# Patient Record
Sex: Female | Born: 1970 | Race: Black or African American | Hispanic: No | Marital: Married
Health system: Southern US, Community
[De-identification: ages and names within clinical notes are randomized; demographics above are authoritative.]

---

## 2011-05-02 DIAGNOSIS — G35 Multiple sclerosis: Secondary | ICD-10-CM | POA: Insufficient documentation

## 2012-02-18 DIAGNOSIS — H52209 Unspecified astigmatism, unspecified eye: Secondary | ICD-10-CM | POA: Insufficient documentation

## 2012-02-18 DIAGNOSIS — Z8669 Personal history of other diseases of the nervous system and sense organs: Secondary | ICD-10-CM | POA: Insufficient documentation

## 2013-06-03 DIAGNOSIS — N926 Irregular menstruation, unspecified: Secondary | ICD-10-CM | POA: Insufficient documentation

## 2015-05-02 ENCOUNTER — Emergency Department (INDEPENDENT_AMBULATORY_CARE_PROVIDER_SITE_OTHER)
Admission: EM | Admit: 2015-05-02 | Discharge: 2015-05-02 | Disposition: A | Discharge status: Home / Self Care | Payer: PRIVATE HEALTH INSURANCE | Source: Home / Self Care | Attending: Emergency Medicine | Admitting: Emergency Medicine

## 2015-05-02 DIAGNOSIS — M84374A Stress fracture, right foot, initial encounter for fracture: Secondary | ICD-10-CM
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
## 2015-05-02 NOTE — Discharge Instructions (Signed)
Stress Fracture Stress fracture is a small break or crack in a bone. A stress fracture can be fully broken (complete) or partially broken (incomplete). The most common sites for stress fractures are the bones in the front of your feet (metatarsals), your heels (calcaneus), and the long bone of your lower leg (tibia). CAUSES A stress fracture is caused by overuse or repetitive exercise, such as running. It happens when a bone cannot absorb any more shock because the muscles around it are weak. Stress fractures happen most commonly when:  You rapidly increase or start a new physical activity.  You use shoes that are worn out or do not fit you properly.  You exercise on a new surface. RISK FACTORS You may be at higher risk for this type of fracture if:  You have a condition that causes weak bones (osteoporosis).  You are female. Stress fractures are more likely to occur in women. SIGNS AND SYMPTOMS The most common symptom of a stress fracture is feeling pain when you are using the affected part of your body. The pain usually goes away when you are resting. Other symptoms may include:  Swelling of the affected area.  Pain in the area when it is touched.  Decreased pain while resting. Stress fracture pain usually develops over time. DIAGNOSIS Diagnosis may include:   Medical history and physical exam.  X-rays.  Bone scan.  MRI. TREATMENT Treatment depends on the severity of your stress fracture. Treatment usually involves resting, icing, compression, and elevation (RICE) of the affected part of your body. Treatment may also include:  Medicines to reduce inflammation.  A cast or a walking shoe.  Crutches.  Surgery. HOME CARE INSTRUCTIONS If You Have a Cast:  Do not stick anything inside the cast to scratch your skin. Doing that increases your risk of infection.  Check the skin around the cast every day. Report any concerns to your health care provider. You may put lotion  on dry skin around the edges of the cast. Do not apply lotion to the skin underneath the cast.  Keep the cast clean and dry.  Cover the cast with a watertight plastic bag to protect it from water while you take a bath or a shower. Do not let the cast get wet.  Do not put pressure on any part of the cast until it is fully hardened. This may take several hours. If You Have a Walking Shoe:  Wear it as directed by your health care provider. Managing Pain, Stiffness, and Swelling  If directed, apply ice to the injured area:  Put ice in a plastic bag.  Place a towel between your skin and the bag.  Leave the ice on for 20 minutes, 2-3 times per day.  Move your fingers or toes often to avoid stiffness and to lessen swelling.  Raise the injured area above the level of your heart while you are sitting or lying down. Activity  Rest as directed by your health care provider. Ask your health care provider if you may do alternative exercises, such as swimming or biking, while you are healing.  Return to your normal activities as directed by your health care provider. Ask your health care provider what activities are safe for you.  Perform range-of-motion exercises only as directed by your health care provider. Safety  Do not use the injured limb to support yourbody weight until your health care provider says that you can. Use crutches if your health care provider tells you to   do so. General Instructions  Do not use any tobacco products, including cigarettes, chewing tobacco, or electronic cigarettes. Tobacco can delay bone healing. If you need help quitting, ask your health care provider.  Take medicines only as directed by your health care provider.  Keep all follow-up visits as directed by your health care provider. This is important. PREVENTION  Only wear shoes that:  Fit well.  Are not worn out.  Eat a healthy diet that contains vitamin D and calcium. This helps keeps your bones  strong.  Be careful when you start a new physical activity. Give your body time to adjust.  Avoid doing only one kind of activity. Do different exercises, such as swimming and running, so that no single part of your body gets overused.  Do strength-training exercises. SEEK MEDICAL CARE IF:  Your pain gets worse.  You have new symptoms.  You have increased swelling. SEEK IMMEDIATE MEDICAL CARE IF:   You lose feeling in the affected area.   This information is not intended to replace advice given to you by your health care provider. Make sure you discuss any questions you have with your health care provider.   Document Released: 03/29/2002 Document Revised: 01/27/2014 Document Reviewed: 08/11/2013 Elsevier Interactive Patient Education 2016 [employer]  

## 2015-05-02 NOTE — ED Notes (Signed)
Right foot injury x 3 weeks ago, still hurts, swollen last night, throbbs while she tries to sleep

## 2015-05-09 ENCOUNTER — Ambulatory Visit (INDEPENDENT_AMBULATORY_CARE_PROVIDER_SITE_OTHER): Payer: PRIVATE HEALTH INSURANCE

## 2015-05-09 ENCOUNTER — Ambulatory Visit (INDEPENDENT_AMBULATORY_CARE_PROVIDER_SITE_OTHER): Payer: PRIVATE HEALTH INSURANCE | Admitting: Family Medicine

## 2015-05-09 VITALS — BP 160/93 | HR 94 | Wt 193.0 lb

## 2015-05-09 DIAGNOSIS — M84374A Stress fracture, right foot, initial encounter for fracture: Secondary | ICD-10-CM

## 2015-05-09 DIAGNOSIS — M79671 Pain in right foot: Secondary | ICD-10-CM

## 2015-05-09 DIAGNOSIS — M7671 Peroneal tendinitis, right leg: Secondary | ICD-10-CM | POA: Insufficient documentation

## 2015-05-09 NOTE — Patient Instructions (Signed)
Thank you for coming in today. Wean to the post op shoe when able.  Take 2000mg  calcium daily (1000 mg twice daily).  Return in 4 weeks.  We will call with results.   Stress Fracture Stress fracture is a small break or crack in a bone. A stress fracture can be fully broken (complete) or partially broken (incomplete). The most common sites for stress fractures are the bones in the front of your feet (metatarsals), your heels (calcaneus), and the long bone of your lower leg (tibia). CAUSES A stress fracture is caused by overuse or repetitive exercise, such as running. It happens when a bone cannot absorb any more shock because the muscles around it are weak. Stress fractures happen most commonly when:  You rapidly increase or start a new physical activity.  You use shoes that are worn out or do not fit you properly.  You exercise on a new surface. RISK FACTORS You may be at higher risk for this type of fracture if:  You have a condition that causes weak bones (osteoporosis).  You are female. Stress fractures are more likely to occur in women. SIGNS AND SYMPTOMS The most common symptom of a stress fracture is feeling pain when you are using the affected part of your body. The pain usually goes away when you are resting. Other symptoms may include:  Swelling of the affected area.  Pain in the area when it is touched.  Decreased pain while resting. Stress fracture pain usually develops over time. DIAGNOSIS Diagnosis may include:   Medical history and physical exam.  X-rays.  Bone scan.  MRI. TREATMENT Treatment depends on the severity of your stress fracture. Treatment usually involves resting, icing, compression, and elevation (RICE) of the affected part of your body. Treatment may also include:  Medicines to reduce inflammation.  A cast or a walking shoe.  Crutches.  Surgery. HOME CARE INSTRUCTIONS If You Have a Cast:  Do not stick anything inside the cast to  scratch your skin. Doing that increases your risk of infection.  Check the skin around the cast every day. Report any concerns to your health care provider. You may put lotion on dry skin around the edges of the cast. Do not apply lotion to the skin underneath the cast.  Keep the cast clean and dry.  Cover the cast with a watertight plastic bag to protect it from water while you take a bath or a shower. Do not let the cast get wet.  Do not put pressure on any part of the cast until it is fully hardened. This may take several hours. If You Have a Walking Shoe:  Wear it as directed by your health care provider. Managing Pain, Stiffness, and Swelling  If directed, apply ice to the injured area:  Put ice in a plastic bag.  Place a towel between your skin and the bag.  Leave the ice on for 20 minutes, 2-3 times per day.  Move your fingers or toes often to avoid stiffness and to lessen swelling.  Raise the injured area above the level of your heart while you are sitting or lying down. Activity  Rest as directed by your health care provider. Ask your health care provider if you may do alternative exercises, such as swimming or biking, while you are healing.  Return to your normal activities as directed by your health care provider. Ask your health care provider what activities are safe for you.  Perform range-of-motion exercises only as directed  by your health care provider. Safety  Do not use the injured limb to support yourbody weight until your health care provider says that you can. Use crutches if your health care provider tells you to do so. General Instructions  Do not use any tobacco products, including cigarettes, chewing tobacco, or electronic cigarettes. Tobacco can delay bone healing. If you need help quitting, ask your health care provider.  Take medicines only as directed by your health care provider.  Keep all follow-up visits as directed by your health care  provider. This is important. PREVENTION  Only wear shoes that:  Fit well.  Are not worn out.  Eat a healthy diet that contains vitamin D and calcium. This helps keeps your bones strong.  Be careful when you start a new physical activity. Give your body time to adjust.  Avoid doing only one kind of activity. Do different exercises, such as swimming and running, so that no single part of your body gets overused.  Do strength-training exercises. SEEK MEDICAL CARE IF:  Your pain gets worse.  You have new symptoms.  You have increased swelling. SEEK IMMEDIATE MEDICAL CARE IF:   You lose feeling in the affected area.   This information is not intended to replace advice given to you by your health care provider. Make sure you discuss any questions you have with your health care provider.   Document Released: 03/29/2002 Document Revised: 01/27/2014 Document Reviewed: 08/11/2013 Elsevier Interactive Patient Education [employer]

## 2015-05-09 NOTE — Progress Notes (Signed)
   Subjective:    I'm seeing this patient as a consultation for:  Susan MangleKaren Sophia PA-C  CC: Right foot pain  HPI: Patient was seen in urgent care on 05/02/2015 for right foot pain and swelling. She's had pain and swelling since late March. Her primary care provider was concerned about the possibility of a stress fracture. X-rays were originally negative. She was not given any immobilization. In urgent care on the 12th again her symptoms and history were suspicious for metatarsal stress fracture. A she was also having some ankle pain she was given a cam walker boot. Since then she's noted significant improvement in symptoms pain and swelling. She has she uses naproxen at night for some soreness which helps. She has a history of MS and takes vitamin D for it.  Past medical history, Surgical history, Family history not pertinant except as noted below, Social history, Allergies, and medications have been entered into the medical record, reviewed, and no changes needed.   Review of Systems: No headache, visual changes, nausea, vomiting, diarrhea, constipation, dizziness, abdominal pain, skin rash, fevers, chills, night sweats, weight loss, swollen lymph nodes, body aches, joint swelling, muscle aches, chest pain, shortness of breath, mood changes, visual or auditory hallucinations.   Objective:    Filed Vitals:   05/09/15 1053  BP: 160/93  Pulse: 94   General: Well Developed, well nourished, and in no acute distress.  Neuro/Psych: Alert and oriented x3, extra-ocular muscles intact, able to move all 4 extremities, sensation grossly intact. Skin: Warm and dry, no rashes noted.  Respiratory: Not using accessory muscles, speaking in full sentences, trachea midline.  Cardiovascular: Pulses palpable, no extremity edema. Abdomen: Does not appear distended. MSK: Right foot and ankle are largely normal appearing. Mildly tender to palpation mid shaft fourth metatarsal and at the ATFL area in the anterior  lateral ankle. Motion pulses capillary refill and sensation are intact. Patient is able to walk without pain using a postoperative shoe.  X-ray foot pending  No results found for this or any previous visit (from the past 24 hour(s)). No results found.  Impression and Recommendations:   45 year old woman with probable right fourth metatarsal shaft stress fracture. X-ray pending. Discussed plan. Plan for calcium and vitamin D postoperative shoe and watchful waiting and rest. Recheck in 4 weeks. If x-ray is normal and symptoms do not improve would consider MRI.  This case required medical decision making of moderate complexity.

## 2015-05-10 NOTE — Progress Notes (Signed)
Quick Note:  Foot xray is pretty normal  ______

## 2015-05-10 NOTE — Progress Notes (Signed)
Quick Note:  Foot xray is pretty normal appearing. We can check a MRI if needed ______

## 2015-06-08 ENCOUNTER — Ambulatory Visit (INDEPENDENT_AMBULATORY_CARE_PROVIDER_SITE_OTHER): Payer: PRIVATE HEALTH INSURANCE | Admitting: Family Medicine

## 2015-06-08 VITALS — BP 133/79 | HR 78 | Wt 194.0 lb

## 2015-06-08 DIAGNOSIS — M84374A Stress fracture, right foot, initial encounter for fracture: Secondary | ICD-10-CM | POA: Diagnosis not present

## 2015-06-08 NOTE — Progress Notes (Signed)
       Susan Livingston Susan Livingston is a 45 y.o. female who [facility] today for foot pain. Patient was seen last month of lateral foot pain. At that time she was thought to have metatarsal stress fracture. She was treated with rigid soled postoperative shoe and notes continued pain. She's frustrated with this and would like definitive diagnosis if possible. Symptoms are moderate in interfere with activity   No past medical history on file. No past surgical history on file. Social History  Substance Use Topics  . Smoking status: Not on file  . Smokeless tobacco: Not on file  . Alcohol Use: Not on file   family history is not on file.  ROS as above Medications: Current Outpatient Prescriptions  Medication Sig Dispense Refill  . amitriptyline (ELAVIL) 10 MG tablet Take 10 mg by mouth at bedtime.    . Cholecalciferol (D 2000) 2000 units TABS     . Dimethyl Fumarate (TECFIDERA) 240 MG CPDR Take by mouth.    . hydrochlorothiazide (HYDRODIURIL) 25 MG tablet Take 25 mg by mouth daily.    . verapamil (VERELAN PM) 240 MG 24 hr capsule Take 240 mg by mouth at bedtime.     No current facility-administered medications for this visit.   Allergies  Allergen Reactions  . Amoxicillin      Exam:  BP 133/79 mmHg  Pulse 78  Wt 194 lb (87.998 kg)  LMP 05/05/2015 Gen: Well NAD Right foot normal-appearing. Mildly tender fifth metatarsal. Nontender peroneal tendon into the ankle.  No results found for this or any previous visit (from the past 24 hour(s)). No results found.   Please see individual assessment and plan sections.

## 2015-06-08 NOTE — Patient Instructions (Signed)
Thank you for coming in today. Get MRI Monday.  Return Wednesday for follow up discussion.

## 2015-06-08 NOTE — Assessment & Plan Note (Signed)
Symptoms consistent with stress fracture although patient is not improving. Plan to obtain MRI right foot to further evaluate stress fracture or tendinitis.

## 2015-06-13 ENCOUNTER — Ambulatory Visit: Payer: PRIVATE HEALTH INSURANCE | Admitting: Family Medicine

## 2015-06-19 ENCOUNTER — Ambulatory Visit (INDEPENDENT_AMBULATORY_CARE_PROVIDER_SITE_OTHER): Payer: PRIVATE HEALTH INSURANCE

## 2015-06-19 DIAGNOSIS — M79671 Pain in right foot: Secondary | ICD-10-CM

## 2015-06-20 NOTE — Progress Notes (Signed)
Quick Note:  No stress fracture is seen. There is some inflammation of a tendon in the bottom of the foot. Return to clinic for repeat exam and further discussion. ______

## 2015-07-02 ENCOUNTER — Ambulatory Visit (INDEPENDENT_AMBULATORY_CARE_PROVIDER_SITE_OTHER): Payer: PRIVATE HEALTH INSURANCE | Admitting: Family Medicine

## 2015-07-02 VITALS — BP 129/78 | HR 90 | Wt 196.0 lb

## 2015-07-02 DIAGNOSIS — M7671 Peroneal tendinitis, right leg: Secondary | ICD-10-CM | POA: Diagnosis not present

## 2015-07-02 MED ORDER — NITROGLYCERIN 0.2 MG/HR TD PT24: MEDICATED_PATCH | TRANSDERMAL | Status: AC

## 2015-07-02 NOTE — Progress Notes (Signed)
Susan Livingston is a 45 y.o. female who presents to [name]: [facility] Sports Medicine today for follow-up right foot pain. Patient's been seen several months now for right foot pain. The diagnosis was thought to be due to a stress fracture. She was treated empirically with immobilization for some time which did not help at all. She had an MRI recently which showed paranasal tendinitis of the longus tendon near the insertion on the medial foot. She notes continued plantar foot pain extending to the lateral portion of the foot. Symptoms are moderate.   No past medical history on file. No past surgical history on file. Social History  Substance Use Topics  . Smoking status: Not on file  . Smokeless tobacco: Not on file  . Alcohol Use: Not on file   family history is not on file.  ROS as above:  Medications: Current Outpatient Prescriptions  Medication Sig Dispense Refill  . amitriptyline (ELAVIL) 10 MG tablet Take 10 mg by mouth at bedtime.    . Cholecalciferol (D 2000) 2000 units TABS     . Dimethyl Fumarate (TECFIDERA) 240 MG CPDR Take by mouth.    . hydrochlorothiazide (HYDRODIURIL) 25 MG tablet Take 25 mg by mouth daily.    . verapamil (VERELAN PM) 240 MG 24 hr capsule Take 240 mg by mouth at bedtime.    . nitroGLYCERIN (NITRODUR - DOSED IN MG/24 HR) 0.2 mg/hr patch Apply 1/4 patch to foot daily for tendonitis 30 patch 1   No current facility-administered medications for this visit.   Allergies  Allergen Reactions  . Amoxicillin      Exam:  BP 129/78 mmHg  Pulse 90  Wt 196 lb (88.905 kg) Gen: Well NAD  Right foot and ankle are normal appearing. Mildly tender to palpation or along the course of the peroneal tendon into the lateral and plantar foot. Pain with resisted foot eversion.  MRI right foot CLINICAL DATA: Medial arch pain and swelling, worsening with weight-bearing for  3 months. No acute injury or prior relevant surgery.  EXAM: MRI OF THE RIGHT HINDFOOT WITHOUT CONTRAST  TECHNIQUE: Multiplanar, multisequence MR imaging was performed. No intravenous contrast was administered.  COMPARISON: Foot radiographs 05/09/2015  FINDINGS: TENDONS  Peroneal: Intact and normally positioned. There is a small amount of fluid surrounding the distal peroneus longus tendon near its insertion on the first metatarsal base and medial cuneiform.  Posteromedial: Intact and normally positioned. A small amount fluid in the posterior tibialis and flexor hallucis longus tendon sheaths is within physiologic limits.  Anterior: Intact and normally positioned.  Achilles: Intact.  Plantar Fascia: Intact. A capsule was placed over the area of pain in the plantar aspect of the medial forefoot. The the underlying subcutaneous fat appears normal. There is no nodularity of the plantar fascia.  LIGAMENTS  Lateral: Intact.  Medial: Intact.  CARTILAGE AND BONES  Ankle Joint: No significant ankle joint effusion. The talar dome and tibial plafond are intact.  Subtalar Joints/Sinus Tarsi: The subtalar joint and tarsal sinus appear normal. No midfoot arthropathic changes identified.  Bones: No significant extra-articular osseous findings.  Other: No significant soft tissue findings.  IMPRESSION: 1. No acute findings or clear explanation for the patient's symptoms. 2. The plantar fascia appears normal. Possible mild distal peroneus longus tenosynovitis in the plantar aspect of the forefoot. 3. No significant arthropathic changes.   Electronically Signed  By: Carey Bullocks M.D.  On: 06/20/2015 09:01  No results found  for this or any previous visit (from the past 24 hour(s)). No results found.    Assessment and Plan: 45 y.o. female with peroneal tendinitis. Plan for physical therapy and home exercise program nitroglycerin patch protocol  and foot and ankle compression. Recheck in 4 weeks.  Discussed warning signs or symptoms. Please see discharge instructions. Patient expresses understanding.

## 2015-07-02 NOTE — Patient Instructions (Signed)
Thank you for coming in today. Do the exercises we discussed.  Attend PT.  Use the ankle compression sleeve.  Return in 4 weeks.   Nitroglycerin Protocol   Apply 1/4 nitroglycerin patch to affected area daily.  Change position of patch within the affected area every 24 hours.  You may experience a headache during the first 1-2 weeks of using the patch, these should subside.  If you experience headaches after beginning nitroglycerin patch treatment, you may take your preferred over the counter pain reliever.  Another side effect of the nitroglycerin patch is skin irritation or rash related to patch adhesive.  Please notify our office if you develop more severe headaches or rash, and stop the patch.  Tendon healing with nitroglycerin patch may require 12 to 24 weeks depending on the extent of injury.  Men should not use if taking Viagra, Cialis, or Levitra.   Do not use if you have migraines or rosacea.   Peroneal Tendinitis With Rehab Tendonitis is inflammation of a tendon. Inflammation of the tendons on the back of the outer ankle (peroneal tendons) is known as peroneal tendonitis. The peroneal tendons are responsible for connecting the muscles that allow you to stand on your tiptoes to the bones of the ankle. For this reason, peroneal tendonitis often causes pain when trying to complete such motions. Peroneal tendonitis often involves a tear (strain) of the peroneal tendons. Strains are classified into three categories. Grade 1 strains cause pain, but the tendon is not lengthened. Grade 2 strains include a lengthened ligament, due to the ligament being stretched or partially ruptured. With grade 2 strains there is still function, although function may be decreased. Grade 3 strains involve a complete tear of the tendon or muscle, and function is usually impaired. SYMPTOMS   Pain, tenderness, swelling, warmth, or redness over the back of the outer side of the ankle, the outer part of  the mid-foot, or the bottom of the arch.  Pain that gets worse with ankle motion (especially when pushing off or pushing down with the front of the foot), or when standing on the ball of the foot or pushing the foot outward.  Crackling sound (crepitation) when the tendon is moved or touched. CAUSES  Peroneal tendinitis occurs when injury to the peroneal tendons causes the body to respond with inflammation. Common causes of injury include:  An overuse injury, in which the groove behind the outer ankle (where the tendon is located) causes wear on the tendon.  A sudden stress placed on the tendon, such as from an increase in the intensity, frequency, or duration of training.  Direct hit (trauma) to the tendon.  Return to activity too soon after a previous ankle injury. RISK INCREASES WITH:  Sports that require sudden, repetitive pushing off of the foot, such as jumping or quick starts.  Kicking and running sports, especially running down hills or long distances.  Poor strength and flexibility.  Previous injury to the foot, ankle, or leg. PREVENTION  Warm up and stretch properly before activity.  Allow for adequate recovery between workouts.  Maintain physical fitness:  Strength, flexibility, and endurance.  Cardiovascular fitness.  Complete rehabilitation after previous injury. PROGNOSIS  If treated properly, peroneal tendonitis usually heals within 6 weeks.  RELATED COMPLICATIONS  Longer healing time, if not properly treated or if not given enough time to heal.  Recurring symptoms if activity is resumed too soon, with overuse, or when using poor technique.  If untreated, tendinitis may result in  tendon rupture, requiring surgery. TREATMENT  Treatment first involves the use of ice and medicine to reduce pain and inflammation. The use of strengthening and stretching exercises may help reduce pain with activity. These exercises may be performed at unsuccessful, surgery to  remove the inflamed tendon lining (sheath) may be advised.  MEDICATION   If pain medicine is needed, nonsteroidal anti-inflammatory medicines (aspirin and ibuprofen), or other minor pain relievers (acetaminophen), are often advised.  Do not take pain medicine for 7 days before surgery.  Prescription pain relievers may be given, if your caregiver thinks they are needed. Use only as directed and only as much as you need. HEAT AND COLD  Cold treatment (icing) should be applied for 10 to 15 minutes every 2 to 3 hours for inflammation and pain, and immediately after activity that aggravates your symptoms. Use ice packs or an ice massage.  Heat treatment may be used before performing stretching and strengthening activities prescribed by your caregiver, physical therapist, or athletic trainer. Use a heat pack or a warm water soak. SEEK MEDICAL CARE IF:  Symptoms get worse or do not improve in 2 to 4 weeks, despite treatment.  New, unexplained symptoms develop. (Drugs used in treatment may produce side effects.) EXERCISES RANGE OF MOTION (ROM) AND STRETCHING EXERCISES - Peroneal Tendinitis These exercises may help you when beginning to rehabilitate your injury. Your symptoms may resolve with or without further involvement from your physician, physical therapist or athletic trainer. While completing these exercises, remember:   Restoring tissue flexibility helps normal motion to return to the joints. This allows healthier, less painful movement and activity.  An effective stretch should be held for at least 30 seconds.  A stretch should never be painful. You should only feel a gentle lengthening or release in the stretched tissue. RANGE OF MOTION - Ankle Eversion  Sit with your right / left ankle crossed over your opposite knee.  Grip your foot with your opposite hand, placing your thumb on the top of your foot and your fingers across the bottom of your foot.  Gently push your foot downward  with a slight rotation, so your littlest toes rise slightly toward the ceiling.  You should feel a gentle stretch on the inside of your ankle. Hold the stretch for __________ seconds. Repeat __________ times. Complete this exercise __________ times per day.  RANGE OF MOTION - Ankle Inversion  Sit with your right / left ankle crossed over your opposite knee.  Grip your foot with your opposite hand, placing your thumb on the bottom of your foot and your fingers across the top of your foot.  Gently pull your foot so the smallest toe comes toward you and your thumb pushes the inside of the ball of your foot away from you.  You should feel a gentle stretch on the outside of your ankle. Hold the stretch for __________ seconds. Repeat __________ times. Complete this exercise __________ times per day.  RANGE OF MOTION - Ankle Plantar Flexion  Sit with your right / left leg crossed over your opposite knee.  Use your opposite hand to pull the top of your foot and toes toward you.  You should feel a gentle stretch on the top of your foot and ankle. Hold this position for __________ seconds. Repeat __________ times. Complete __________ times per day.  STRETCH - Gastroc, Standing  Place your hands on a wall.  Extend your right / left leg behind you, keeping the front knee somewhat bent.  Slightly point your toes inward on your back foot.  Keeping your right / left heel on the floor and your knee straight, shift your weight toward the wall, not allowing your back to arch.  You should feel a gentle stretch in the calf. Hold this position for __________ seconds. Repeat __________ times. Complete this stretch __________ times per day. STRETCH - Soleus, Standing  Place your hands on a wall.  Extend your right / left leg behind you, keeping the other knee somewhat bent.  Slightly point your toes inward on your back foot.  Keep your heel on the floor, bend your back knee, and slightly shift  your weight over the back leg so that you feel a gentle stretch deep in your back calf.  Hold this position for __________ seconds. Repeat __________ times. Complete this stretch __________ times per day. STRETCH - Gastrocsoleus, Standing Note: This exercise can place a lot of stress on your foot and ankle. Please complete this exercise only if specifically instructed by your caregiver.   Place the ball of your right / left foot on a step, keeping your other foot firmly on the same step.  Hold on to the wall or a rail for balance.  Slowly lift your other foot, allowing your body weight to press your heel down over the edge of the step.  You should feel a stretch in your right / left calf.  Hold this position for __________ seconds.  Repeat this exercise with a slight bend in your knee. Repeat __________ times. Complete this stretch __________ times per day.  STRENGTHENING EXERCISES - Peroneal Tendinitis  These exercises may help you when beginning to rehabilitate your injury. They may resolve your symptoms with or without further involvement from your physician, physical therapist or athletic trainer. While completing these exercises, remember:   Muscles can gain both the endurance and the strength needed for everyday activities through controlled exercises.  Complete these exercises as instructed by your physician, physical therapist or athletic trainer. Increase the resistance and repetitions only as guided by your caregiver. STRENGTH - Dorsiflexors  Secure a rubber exercise band or tubing to a fixed object (table, pole) and loop the other end around your right / left foot.  Sit on the floor facing the fixed object. The band should be slightly tense when your foot is relaxed.  Slowly draw your foot back toward you, using your ankle and toes.  Hold this position for __________ seconds. Slowly release the tension in the band and return your foot to the starting position. Repeat  __________ times. Complete this exercise __________ times per day.  STRENGTH - Towel Curls  Sit in a chair, on a non-carpeted surface.  Place your foot on a towel, keeping your heel on the floor.  Pull the towel toward your heel only by curling your toes. Keep your heel on the floor.  If instructed by your physician, physical therapist or athletic trainer, add weight to the end of the towel. Repeat __________ times. Complete this exercise __________ times per day. STRENGTH - Ankle Eversion   Secure one end of a rubber exercise band or tubing to a fixed object (table, pole). Loop the other end around your foot, just before your toes.  Place your fists between your knees. This will focus your strengthening at your ankle.  Drawing the band across your opposite foot, away from the pole, slowly, pull your little toe out and up. Make sure the band is positioned to resist the  entire motion.  Hold this position for __________ seconds.  Have your muscles resist the band, as it slowly pulls your foot back to the starting position. Repeat __________ times. Complete this exercise __________ times per day.    This information is not intended to replace advice given to you by your health care provider. Make sure you discuss any questions you have with your health care provider.   Document Released: 01/06/2005 Document Revised: 05/23/2014 Document Reviewed: 04/20/2008 Elsevier Interactive Patient Education [employer] Inc.

## 2015-07-05 ENCOUNTER — Encounter: Payer: Self-pay | Admitting: Physical Therapy

## 2015-07-05 ENCOUNTER — Ambulatory Visit (INDEPENDENT_AMBULATORY_CARE_PROVIDER_SITE_OTHER): Payer: PRIVATE HEALTH INSURANCE | Admitting: Physical Therapy

## 2015-07-05 DIAGNOSIS — M6281 Muscle weakness (generalized): Secondary | ICD-10-CM | POA: Diagnosis not present

## 2015-07-05 DIAGNOSIS — M25671 Stiffness of right ankle, not elsewhere classified: Secondary | ICD-10-CM

## 2015-07-05 DIAGNOSIS — M79671 Pain in right foot: Secondary | ICD-10-CM | POA: Diagnosis not present

## 2015-07-05 NOTE — Patient Instructions (Signed)
Ankle Alphabet -     [place] [phone]    Using left ankle and foot only, trace the letters of the alphabet. Perform A to Z. Repeat __1__ times per set. Do __1__ sets per session. Do _2___ sessions per day.  ROM: Plantar / Dorsiflexion    With left leg relaxed, gently flex and extend ankle. Move through full range of motion. Avoid pain. Repeat _15__ times per set. Do ___1_ sets per session. Do ____ sessions per day. 2 ROM: Inversion / Eversion    With left leg relaxed, gently turn ankle and foot in and out. Move through full range of motion. Avoid pain. Repeat _15___ times per set. Do __1__ sets per session. Do __2__ sessions per day.   Gastroc Stretch    Stand with right foot back, leg straight, forward leg bent. Keeping heel on floor, turned slightly out, lean into wall until stretch is felt in calf. Hold _45___ seconds. Repeat __1__ times per set. Do __1__ sets per session. Do _2_ sessions per day. Copyright  VHI. All rights reserved.

## 2015-07-05 NOTE — Therapy (Signed)
[facility] [address] [place], [place], [address] Phone: [phone]   Fax:  [phone]  Physical Therapy Evaluation  Patient Details  Name: Susan LevanJoy Livingston MRN: [id number] Date of Birth: Nov 04, 1970 Referring Provider: Dr Denyse Amassorey  Encounter Date: 07/05/2015      PT End of Session - 07/05/15 1658    Visit Number 1   Number of Visits 8   Date for PT Re-Evaluation 08/02/15   PT Start Time 1621   PT Stop Time 1658   PT Time Calculation (min) 37 min   Activity Tolerance Patient tolerated treatment well      History reviewed. No pertinent past medical history.  History reviewed. No pertinent past surgical history.  There were no vitals filed for this visit.       Subjective Assessment - 07/05/15 1621    Subjective Pt reports she developed Rt LE pain about 3 months ago, thought she had a stress fx, was immobilized for 3 wks the a cast shoe for two. This didn't help the problem, had an MRI and it showes tendonitis. Currenlty wearing body heel brace on her Rt ankle and began using nirtro patch on her heel.     Pertinent History 1993 3 screws in Lt fibula   How long can you sit comfortably? no limitations   How long can you walk comfortably? has self limited her walking because she doesn't want to hurt the foot any more.    Diagnostic tests MRI    Patient Stated Goals wear her sandles, use stair master and walk at least 3x/wk    Currently in Pain? No/denies  has pain with a lot of walking and then going to sit, has throbbing, she elevates it.             [facility] - 07/05/15 0001    Assessment   Medical Diagnosis Rt peroneal tendonitis   Referring Provider Dr Denyse Amassorey   Onset Date/Surgical Date 04/04/15   Hand Dominance Right   Next MD Visit 07/30/15   Prior Therapy none   Precautions   Precautions None   Required Braces or Orthoses --  ankle compression sleeve when up   Balance Screen   Has the patient  fallen in the past 6 months No   Has the patient had a decrease in activity level because of a fear of falling?  No   Is the patient reluctant to leave their home because of a fear of falling?  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   Leisure exercise, reading, listen to music   Observation/Other Assessments   Focus on Therapeutic Outcomes (FOTO)  39% limited   Functional Tests   Functional tests Squat;Lunges;Single leg stance   Squat   Comments WNL, crepitis in bilat knees   Lunges   Comments WNL   Single Leg Stance   Comments WNL   Posture/Postural Control   Posture/Postural Control No significant limitations   ROM / Strength   AROM / PROM / Strength AROM;PROM;Strength   AROM   AROM Assessment Site Ankle   Right/Left Ankle Left;Right   Right Ankle Dorsiflexion 2   Right Ankle Plantar Flexion 48   Right Ankle Inversion 22   Right Ankle  Eversion 13   Left Ankle Dorsiflexion 12   Left Ankle Plantar Flexion 36   Left Ankle Inversion 22   Left Ankle Eversion 20   PROM   PROM Assessment Site Ankle   Right/Left Ankle Right   Right Ankle Dorsiflexion 7   Right Ankle Eversion 23   Strength   Strength Assessment Site Hip;Knee;Ankle   Right/Left Hip --  bilat WNL except hip extension 4+/5   Right/Left Knee --  bilat WNL   Right/Left Ankle Right  Lt WNl   Right Ankle Dorsiflexion 4+/5   Right Ankle Plantar Flexion 3-/5   Right Ankle Inversion 4-/5   Right Ankle Eversion 4/5   Palpation   Palpation comment point tenderness at insertion of peroneal longis an dbrevis.   good Rt talus and calcaneous motion   Ambulation/Gait   Ambulation Distance (Feet) --  in gym   Assistive device None   Gait Pattern Antalgic                    [facility] Adult PT Treatment/Exercise - 07/05/15 0001    Exercises   Exercises Ankle   Modalities   Modalities Iontophoresis   Iontophoresis   Type of Iontophoresis Dexamethasone   Location bottom of Rt foot to cover peroneal brevis and longus   Dose 1.0cc   Time 6 hr stat patch   Ankle Exercises: Stretches   Gastroc Stretch --  45sec Rt   Ankle Exercises: Seated   ABC's 1 rep   Ankle Circles/Pumps Strengthening;Right;15 reps   Towel Inversion/Eversion --  15   Other Seated Ankle Exercises ankle DF/PF                PT Education - 07/05/15 1659    Education provided Yes   Education Details HEP & ionto   Person(s) Educated Patient   Methods Explanation;Demonstration;Handout   Comprehension Returned demonstration;Verbalized understanding             PT Long Term Goals - 07/05/15 1710    PT LONG TERM GOAL #1   Title increase Rt ankle DF =/> 12 degrees ( 08/02/15)    Time 4   Period Weeks   Status New   PT LONG TERM GOAL #2   Title demo Rt ankle strength =/> 5/5 ( 08/02/15)    Time 4   Period Weeks   Status New   PT LONG TERM GOAL #3   Title report =/> 75% reduction of pain with walking ( 08/02/15)    Time 4   Period Weeks   Status New   PT LONG TERM GOAL #4   Title improve FOTO =/< 28% limited ( 08/02/15)    Time 4   Period Weeks   Status New               Plan - 07/05/15 1659    Clinical Impression Statement 45 yo female with 3 month h/o Rt foot pain, was treated as a stress fx for ~ 6 wks, pain continued MRI shows peroneal tendonitis.  She is currently wearing a compression sleeve, has decreased ROM, strength and tenderness on palpation.  She is using a nitro patch on her foot. 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    Rehab Potential Good   PT Frequency 2x / week   PT Duration 4 weeks   PT Treatment/Interventions Ultrasound;Neuromuscular re-education;Patient/family education;Cryotherapy;Electrical  Stimulation;Iontophoresis /ml Dexamethasone;Moist Heat;Therapeutic exercise;Manual techniques;Vasopneumatic Device;Dry needling;Taping   PT Next Visit Plan progress  HEP - eccentric peroneal work, Engineer, drilling, [place]   Consulted and Agree with Plan of Care Patient      Patient will benefit from skilled therapeutic intervention in order to improve the following deficits and impairments:  Decreased strength, Pain, Difficulty walking, Decreased range of motion  Visit Diagnosis: Pain in right foot - Plan: PT plan of care cert/re-cert  Stiffness of right ankle, not elsewhere classified - Plan: PT plan of care cert/re-cert  Muscle weakness (generalized) - Plan: PT plan of care cert/re-cert     Problem List Patient Active Problem List   Diagnosis Date Noted  . Peroneal tendonitis of right lower extremity 05/09/2015  . Irregular bleeding 06/03/2013  . Astigmatism 02/18/2012  . History of disorder of central nervous system 02/18/2012  . Multiple sclerosis (HCC) 05/02/2011  . Headache, migraine 01/27/2007    Roderic Scarce PT  07/05/2015, 5:49 PM  [facility] [address] [place], [place], [address] Phone: [phone]   Fax:  [phone]  Name: Susan Livingston MRN: [id number] Date of Birth: 08/28/1970

## 2015-07-12 ENCOUNTER — Ambulatory Visit (INDEPENDENT_AMBULATORY_CARE_PROVIDER_SITE_OTHER): Payer: PRIVATE HEALTH INSURANCE | Admitting: Physical Therapy

## 2015-07-12 DIAGNOSIS — M25671 Stiffness of right ankle, not elsewhere classified: Secondary | ICD-10-CM

## 2015-07-12 DIAGNOSIS — M79671 Pain in right foot: Secondary | ICD-10-CM

## 2015-07-12 DIAGNOSIS — M6281 Muscle weakness (generalized): Secondary | ICD-10-CM

## 2015-07-12 NOTE — Therapy (Signed)
[facility] [address] [place], [place], [address] Phone: [phone]   Fax:  [phone]  Physical Therapy Treatment  Patient Details  Name: Susan LevanJoy Colver MRN: [id number] Date of Birth: 06-Feb-1970 Referring Provider: Dr Denyse Amassorey  Encounter Date: 07/12/2015      PT End of Session - 07/12/15 1151    Visit Number 2   Number of Visits 8   Date for PT Re-Evaluation 08/02/15   PT Start Time 1150   PT Stop Time 1230   PT Time Calculation (min) 40 min   Activity Tolerance Patient tolerated treatment well;No increased pain      No past medical history on file.  No past surgical history on file.  There were no vitals filed for this visit.      Subjective Assessment - 07/12/15 1154    Subjective Pt reports she has been doing her exercises and hasn't had any pain, except after a 45 min walk in neighborhood.  Pain in Rt ankle was up to 6/10; she stretched and rested and pain resolved.  Continues to use nitro patch daily.    Patient Stated Goals wear her sandals, use stair master and walk at least 3x/wk    Currently in Pain? No/denies         [facility] Adult PT Treatment/Exercise - 07/12/15 0001    Ankle Exercises: Stretches   Soleus Stretch 2 reps;30 seconds   Gastroc Stretch 2 reps;60 seconds   Other Stretch Heel cord stretch off edge of step x 30 sec    Ankle Exercises: Aerobic   Elliptical L1.5: 4.5 min    Ankle Exercises: Supine   T-Band Green band x 10 reps each DF, inversion, eversion - 2 sets  corrected form of how performed (issued by MD)   Ankle Exercises: Standing   SLS RLE with horiz head turns x 30 sec, then vertical head motion.   Then SLS on blue pad x 30 sec x 2 trials.    Heel Raises 10 reps  3 sets, toes in, out, straight          PT Education - 07/12/15 1228    Education provided Yes   Education Details HEP   Person(s) Educated Patient   Methods Explanation   Comprehension Verbalized  understanding;Returned demonstration             PT Long Term Goals - 07/12/15 1238    PT LONG TERM GOAL #1   Title increase Rt ankle DF =/> 12 degrees ( 08/02/15)    Time 4   Period Weeks   Status On-going   PT LONG TERM GOAL #2   Title demo Rt ankle strength =/> 5/5 ( 08/02/15)    Time 4   Period Weeks   Status On-going   PT LONG TERM GOAL #3   Title report =/> 75% reduction of pain with walking ( 08/02/15)    Time 4   Period Weeks   Status On-going   PT LONG TERM GOAL #4   Title improve FOTO =/< 28% limited ( 08/02/15)    Time 4   Period Weeks   Status On-going               Plan - 07/12/15 1232    Clinical Impression Statement Pt tolerated all exercises well, without increase in pain or symptoms.  She required some cues for correcting form on existing exercises given by MD at last appt. Pt declined use of modalities at end  of session.  Progressing well towards goals.    Rehab Potential Good   PT Frequency 2x / week   PT Duration 4 weeks   PT Treatment/Interventions Ultrasound;Neuromuscular re-education;Patient/family education;Cryotherapy;Electrical Stimulation;Iontophoresis 4mg /ml Dexamethasone;Moist Heat;Therapeutic exercise;Manual techniques;Vasopneumatic Device;Dry needling;Taping   PT Next Visit Plan Check ROM, tenderness in foot.  Ionto/US as indicated.     Consulted and Agree with Plan of Care Patient      Patient will benefit from skilled therapeutic intervention in order to improve the following deficits and impairments:  Decreased strength, Pain, Difficulty walking, Decreased range of motion  Visit Diagnosis: Pain in right foot  Stiffness of right ankle, not elsewhere classified  Muscle weakness (generalized)     Problem List Patient Active Problem List   Diagnosis Date Noted  . Peroneal tendonitis of right lower extremity 05/09/2015  . Irregular bleeding 06/03/2013  . Astigmatism 02/18/2012  . History of disorder of central nervous  system 02/18/2012  . Multiple sclerosis (HCC) 05/02/2011  . Headache, migraine 01/27/2007   Mayer CamelJennifer Carlson-Long, PTA 07/12/2015 12:38 PM  [facility] [address] [place], [place], [address] Phone: [phone]   Fax:  [phone]  Name: Susan LevanJoy Livingston MRN: [id number] Date of Birth: 1970/04/22

## 2015-07-12 NOTE — Patient Instructions (Signed)
Balance: Unilateral - Foam    Eyes open, balance with right leg on dense foam. Hold __30__ seconds. Repeat __2-3__ times per set. Do __1__ sets per session. Do ____ sessions per day. Perform exercise with eyes closed.  http://orth.exer.us/50   Copyright  VHI. All rights reserved.  Heel Raises    Stand with support.With knees straight, raise heels off ground. Hold _1__ seconds. Relax for _1__ seconds.  Toes in, toes out, toes straight - 5-10 of each of those.  Every other day.   Calf stretches every day.

## 2015-07-20 ENCOUNTER — Ambulatory Visit (INDEPENDENT_AMBULATORY_CARE_PROVIDER_SITE_OTHER): Payer: PRIVATE HEALTH INSURANCE | Admitting: Rehabilitative and Restorative Service Providers"

## 2015-07-20 ENCOUNTER — Encounter: Payer: Self-pay | Admitting: Rehabilitative and Restorative Service Providers"

## 2015-07-20 DIAGNOSIS — M79671 Pain in right foot: Secondary | ICD-10-CM | POA: Diagnosis not present

## 2015-07-20 DIAGNOSIS — M6281 Muscle weakness (generalized): Secondary | ICD-10-CM

## 2015-07-20 DIAGNOSIS — M25671 Stiffness of right ankle, not elsewhere classified: Secondary | ICD-10-CM | POA: Diagnosis not present

## 2015-07-20 NOTE — Patient Instructions (Signed)
Forward    Facing step, place one leg on step, flexed at hip. Step up slowly, bringing hips in line with knee and shoulder. Bring other foot onto step. Reverse process to step back down. Repeat with other leg. Do __10__ repetitions, _3___ sets.   Lateral Step Up    Stand to side of step. Step up leading with left leg. Slowly step down leading with opposite leg. Perform _30 __ reps.   Balance: Unilateral - Forward Lean    Stand on left foot, hands on hips. Keeping hips level, bend forward as if to touch forehead to wall. Hold __2-5__ seconds. Relax. Repeat __10__ times per set. Do __1-3__ sets per session. Do _2__ sessions per day. Can reach to touch a chair seat

## 2015-07-20 NOTE — Therapy (Signed)
[facility] [address]  [address] [place], [place], [address] Phone: [phone]   Fax:  [phone]  Physical Therapy Treatment  Patient Details  Name: Susan Livingston MRN: [id number] Date of Birth: 1970-07-11 Referring Provider: Dr Denyse Amassorey  Encounter Date: 07/20/2015      PT End of Session - 07/20/15 1142    Visit Number 3   Number of Visits 8   Date for PT Re-Evaluation 08/02/15   PT Start Time 1103   PT Stop Time 1142   PT Time Calculation (min) 39 min   Activity Tolerance Patient tolerated treatment well      History reviewed. No pertinent past medical history.  History reviewed. No pertinent past surgical history.  There were no vitals filed for this visit.      Subjective Assessment - 07/20/15 1104    Subjective Patient reports that her ankle is feeling a lot better. Feels last session was a hard workout but helpful.  She is doing her exercises at home.   Currently in Pain? No/denies            [facility] PT Assessment - 07/20/15 0001    Assessment   Medical Diagnosis Rt peroneal tendonitis   Referring Provider Dr Denyse Amassorey   Onset Date/Surgical Date 04/04/15   Hand Dominance Right   Next MD Visit 07/30/15   Prior Therapy none   AROM   Right Ankle Dorsiflexion 10   Right Ankle Plantar Flexion 56   Right Ankle Inversion 28   Right Ankle Eversion 22   Strength   Right Ankle Dorsiflexion --  5-/5   Right Ankle Inversion 4+/5   Right Ankle Eversion 4+/5   Palpation   Palpation comment point tenderness at insertion of peroneal longis and brevis.                      [facility] Adult PT Treatment/Exercise - 07/20/15 0001    Iontophoresis   Type of Iontophoresis Dexamethasone   Location across dorsum of Rt foot to cover peroneal brevis and longus   Dose 1.0cc   Time 6 hr stat patch   Ankle Exercises: Aerobic   Elliptical L1.5: 4 min    Ankle Exercises: Stretches   Soleus Stretch 3 reps;30 seconds   Gastroc Stretch 3 reps;60 seconds   Other Stretch prostretch 30 sec x3    Ankle Exercises: Standing   Vector Stance --  reach fwd to touch chair wt on Rt LE x 10 reps    SLS RLE with horiz head turns x 30 sec, then vertical head motion.   Then SLS on blue pad x 30 sec x 2 trials.    Heel Raises 10 reps  3 sets, toes in, out, straight   Other Standing Ankle Exercises step up fwd Rt LE x 30; hell tap with Rt LE on step x 30 (6 in step)    Ankle Exercises: Supine   T-Band Green band x 10 reps each DF, inversion, eversion - 2 sets  corrected form of how performed (issued by MD)                PT Education - 07/20/15 1130    Education provided Yes   Education Details HEP   Person(s) Educated Patient   Methods Explanation;Demonstration;Verbal cues;Handout   Comprehension Verbalized understanding;Returned demonstration;Verbal cues required;Need further instruction             PT Long Term Goals - 07/20/15 1105  PT LONG TERM GOAL #1   Title increase Rt ankle DF =/> 12 degrees ( 08/02/15)    Time 4   Period Weeks   Status On-going   PT LONG TERM GOAL #2   Title demo Rt ankle strength =/> 5/5 ( 08/02/15)    Time 4   Period Weeks   Status On-going   PT LONG TERM GOAL #3   Title report =/> 75% reduction of pain with walking ( 08/02/15)    Time 4   Period Weeks   Status On-going   PT LONG TERM GOAL #4   Title improve FOTO =/< 28% limited ( 08/02/15)    Time 4   Period Weeks   Status On-going               Plan - 07/20/15 1142    Clinical Impression Statement Doing well - can tell her Fae P[place]nle is getting better. Strength and ROM increasing. Progressing well toward stated goals of therapy.    Rehab Potential Good   PT Frequency 2x / week   PT Duration 4 weeks   PT Treatment/Interventions Ultrasound;Neuromuscular re-education;Patient/family education;Cryotherapy;Electrical Stimulation;Iontophoresis 4mg /ml Dexamethasone;Moist Heat;Therapeutic exercise;Manual  techniques;Vasopneumatic Device;Dry needling;Taping   PT Next Visit Plan Continue strengthening/stretching/onto/US as indicated.     Consulted and Agree with Plan of Care Patient      Patient will benefit from skilled therapeutic intervention in order to improve the following deficits and impairments:  Decreased strength, Pain, Difficulty walking, Decreased range of motion  Visit Diagnosis: Pain in right foot  Stiffness of right ankle, not elsewhere classified  Muscle weakness (generalized)     Problem List Patient Active Problem List   Diagnosis Date Noted  . Peroneal tendonitis of right lower extremity 05/09/2015  . Irregular bleeding 06/03/2013  . Astigmatism 02/18/2012  . History of disorder of central nervous system 02/18/2012  . Multiple sclerosis (HCC) 05/02/2011  . Headache, migraine 01/27/2007    Ahmyah Gidley Rober MinionP Bora Bost PT, MPH  07/20/2015, 11:46 AM  [facility] [address] [place], [place], [address] Phone: [phone]   Fax:  [phone]  Name: Susan Livingston MRN: [id number] Date of Birth: [birth year]

## 2015-07-23 ENCOUNTER — Encounter: Payer: Self-pay | Admitting: Rehabilitative and Restorative Service Providers"

## 2015-07-23 ENCOUNTER — Ambulatory Visit (INDEPENDENT_AMBULATORY_CARE_PROVIDER_SITE_OTHER): Payer: PRIVATE HEALTH INSURANCE | Admitting: Rehabilitative and Restorative Service Providers"

## 2015-07-23 DIAGNOSIS — M25671 Stiffness of right ankle, not elsewhere classified: Secondary | ICD-10-CM

## 2015-07-23 DIAGNOSIS — M6281 Muscle weakness (generalized): Secondary | ICD-10-CM | POA: Diagnosis not present

## 2015-07-23 DIAGNOSIS — M79671 Pain in right foot: Secondary | ICD-10-CM

## 2015-07-23 NOTE — Therapy (Addendum)
[facility] [place], [place], [address] Phone: [phone]   Fax:  [phone]  Physical Therapy Treatment  Patient Details  Name: Brealynn Contino MRN: [id number] Date of Birth: 04-23-1970 Referring Provider: Dr Georgina Snell  Encounter Date: 07/23/2015      PT End of Session - 07/23/15 1107    Visit Number 4   Number of Visits 8   Date for PT Re-Evaluation 08/02/15   PT Start Time 1100   PT Stop Time 1145   PT Time Calculation (min) 45 min   Activity Tolerance Patient tolerated treatment well      History reviewed. No pertinent past medical history.  History reviewed. No pertinent past surgical history.  There were no vitals filed for this visit.      Subjective Assessment - 07/23/15 1106    Subjective Patient reports that she was on her feet and dancing and walking a lot over the weekend celebrating her grandmothers 90th birthday.    Currently in Pain? Yes   Pain Score 3    Pain Location Ankle   Pain Orientation Right   Pain Descriptors / Indicators Sore   Pain Type Acute pain                         [facility] Adult PT Treatment/Exercise - 07/23/15 0001    Exercises   Exercises --  exercises without ankle support today    Iontophoresis   Type of Iontophoresis Dexamethasone   Location across dorsum of Rt foot to cover peroneal brevis and longus   Dose 1.0cc   Time 6 hr stat patch   Manual Therapy   Joint Mobilization joint mobs Rt ankle/foot with passive stretch    Soft tissue mobilization deep tissue work to tol at lateral ankle - talocrual ligament and fibularius tendons    Ankle Exercises: Aerobic   Stationary Bike Nustep L4 x 6 min    Ankle Exercises: Stretches   Soleus Stretch 3 reps;30 seconds   Gastroc Stretch 3 reps;60 seconds   Other Stretch prostretch 30 sec x3    Ankle Exercises: Standing   Vector Stance --  fwd reach to touch chair - x10    SLS RLE with horiz head turns  x 30 sec, then vertical head motion.   Then SLS on blue pad x 30 sec x 2 trials.    Heel Raises 10 reps  3 sets, toes in, out, straight   Other Standing Ankle Exercises step up fwd Rt LE x 30; heel tap with Rt LE on step x 30 (6 in step)    Ankle Exercises: Supine   T-Band Green band x 10 reps each DF, inversion, eversion - 2 sets  corrected form for DF to avoid inversion                 PT Education - 07/23/15 1150    Education provided Yes   Education Details patient will begin to do some exercise and walking without ankle support - as tolerated    Person(s) Educated Patient   Methods Explanation   Comprehension Verbalized understanding             PT Long Term Goals - 07/23/15 1151    PT LONG TERM GOAL #1   Title increase Rt ankle DF =/> 12 degrees ( 08/02/15)    Time 4   Period Weeks   Status On-going   PT LONG TERM GOAL #  2   Title demo Rt ankle strength =/> 5/5 ( 08/02/15)    Time 4   Period Weeks   Status On-going   PT LONG TERM GOAL #3   Title report =/> 75% reduction of pain with walking ( 08/02/15)    Time 4   Period Weeks   Status Achieved   PT LONG TERM GOAL #4   Title improve FOTO =/< 28% limited ( 08/02/15)    Time 4   Period Weeks   Status On-going               Plan - 07/23/15 1153    Clinical Impression Statement Some soreness but progressing well. Tolerated exercise in the clinic today without ankle support. Continues to progress with less palpable tightness and pain in the lateral ankle. progressing well toward goals of therapy.    Rehab Potential Good   PT Frequency 2x / week   PT Duration 4 weeks   PT Treatment/Interventions Ultrasound;Neuromuscular re-education;Patient/family education;Cryotherapy;Electrical Stimulation;Iontophoresis 20m/ml Dexamethasone;Moist Heat;Therapeutic exercise;Manual techniques;Vasopneumatic Device;Dry needling;Taping   PT Next Visit Plan Continue strengthening/stretching/onto/US as indicated.      Consulted and Agree with Plan of Care Patient      Patient will benefit from skilled therapeutic intervention in order to improve the following deficits and impairments:  Decreased strength, Pain, Difficulty walking, Decreased range of motion  Visit Diagnosis: Pain in right foot  Stiffness of right ankle, not elsewhere classified  Muscle weakness (generalized)     Problem List Patient Active Problem List   Diagnosis Date Noted  . Peroneal tendonitis of right lower extremity 05/09/2015  . Irregular bleeding 06/03/2013  . Astigmatism 02/18/2012  . History of disorder of central nervous system 02/18/2012  . [place] 05/02/2011  . Headache, migraine 01/27/2007    Ceasar Decandia PNilda SimmerPT, MPH  07/23/2015, 11:58 AM  [facility] [address]Phone: [phone]  Fax:  [phone] Name: JSuzane VanderweideMRN: [id number] of Birth: 1Jan 05, 1972  PHYSICAL THERAPY DISCHARGE SUMMARY  Visits from Start of Care: 4  Current functional level related to goals / functional outcomes: Excellent progress. Patient has returned to normal functional activities.    Remaining deficits: Should continue with HEP   Education / Equipment: HEP Plan: Patient agrees to discharge.  Patient goals were met. Patient is being discharged due to meeting the stated rehab goals.  ?????    Jamar Weatherall P. HHelene KelpPT, MPH 12/17/15 7:38 AM

## 2015-07-25 ENCOUNTER — Encounter: Payer: PRIVATE HEALTH INSURANCE | Admitting: Physical Therapy

## 2015-07-30 ENCOUNTER — Ambulatory Visit (INDEPENDENT_AMBULATORY_CARE_PROVIDER_SITE_OTHER): Payer: PRIVATE HEALTH INSURANCE | Admitting: Family Medicine

## 2015-07-30 VITALS — BP 145/87 | HR 73 | Wt 193.0 lb

## 2015-07-30 DIAGNOSIS — M7671 Peroneal tendinitis, right leg: Secondary | ICD-10-CM

## 2015-07-30 NOTE — Progress Notes (Signed)
       Susan Livingston is a 45 y.o. female [facility] today for follow-up foot pain. Patient has been seen several times now for. Tendinitis. She's attended physical therapy and feels 100% better. She is no longer using any braces and is ready to resume normal activity.   No past medical history on file. No past surgical history on file. Social History  Substance Use Topics  . Smoking status: Not on file  . Smokeless tobacco: Not on file  . Alcohol Use: Not on file   family history is not on file.  ROS as above:  Medications: Current Outpatient Prescriptions  Medication Sig Dispense Refill  . amitriptyline (ELAVIL) 10 MG tablet Take 10 mg by mouth at bedtime.    . Cholecalciferol (D 2000) 2000 units TABS     . Dimethyl Fumarate (TECFIDERA) 240 MG CPDR Take by mouth.    . hydrochlorothiazide (HYDRODIURIL) 25 MG tablet Take 25 mg by mouth daily.    . nitroGLYCERIN (NITRODUR - DOSED IN MG/24 HR) 0.2 mg/hr patch Apply 1/4 patch to foot daily for tendonitis 30 patch 1  . verapamil (VERELAN PM) 240 MG 24 hr capsule Take 240 mg by mouth at bedtime.     No current facility-administered medications for this visit.   Allergies  Allergen Reactions  . Amoxicillin      Exam:  BP 145/87 mmHg  Pulse 73  Wt 193 lb (87.544 kg) Gen: Well NAD Right foot normal-appearing nontender normal motion and strength.  No results found for this or any previous visit (from the past 24 hour(s)). No results found.    Assessment and Plan: 45 y.o. female with resolved perineal tendinitis. No further physical therapy or follow-up as needed. Return as needed.  Discussed warning signs or symptoms. Please see discharge instructions. Patient expresses understanding.

## 2015-07-30 NOTE — Patient Instructions (Signed)
Thank you for coming in today.   Continue the exercises.   Return as needed.  

## 2016-08-13 IMAGING — MR MR FOOT*R* W/O CM
6 series · 40 of 40 positions shown · non-contrast
Comparison: Foot radiographs 05/09/2015

CLINICAL DATA: Medial arch pain and swelling, worsening with
weight-bearing for 3 months. No acute injury or prior relevant
surgery.

EXAM:
MRI OF THE RIGHT HINDFOOT WITHOUT CONTRAST
TECHNIQUE: Multiplanar, multisequence MR imaging was performed. No intravenous
contrast was administered.

[Series 3: PD fat-sat · axial · 3.0mm · 0.53mm/px · z∈[-124,-12]mm · 6 of 35 slices shown]
[im 1/35]
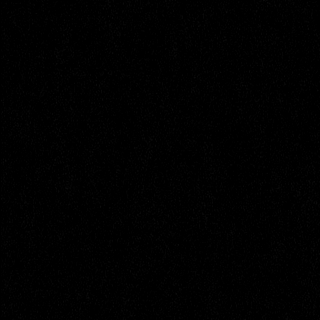
[im 7/35]
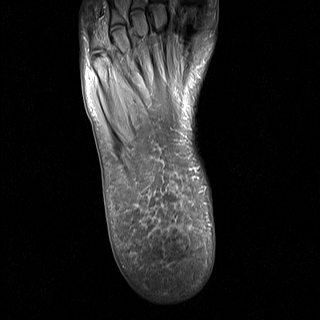
[im 14/35]
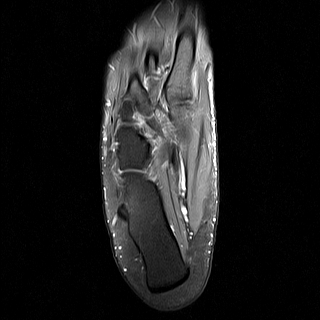
[im 21/35]
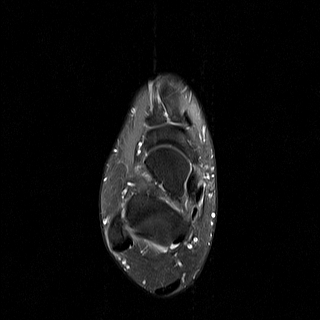
[im 28/35]
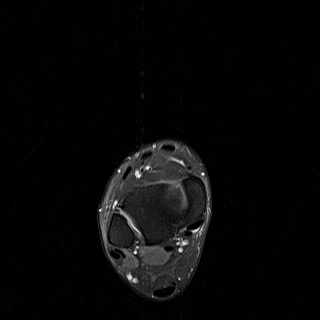
[im 35/35]
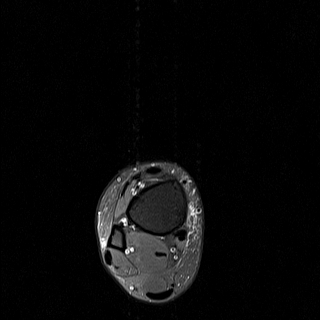

[Series 4: T2 fat-sat · axial · 3.0mm · 0.53mm/px · z∈[-124,-12]mm · 6 of 35 slices shown (1 of 2)]
[im 1/35]
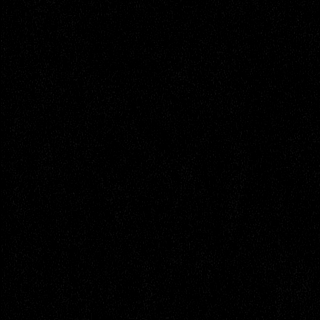
[im 7/35]
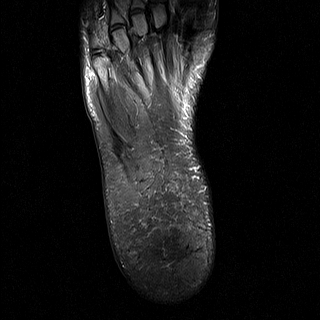
[im 14/35]
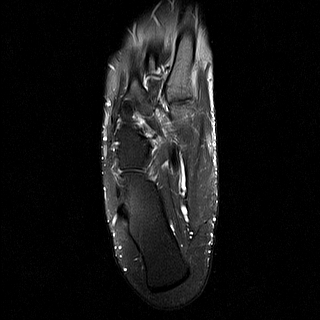
[im 21/35]
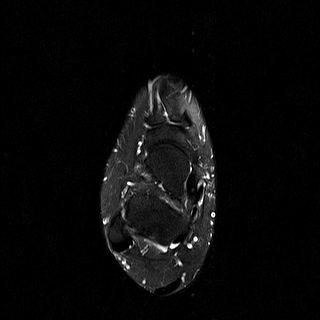
[im 28/35]
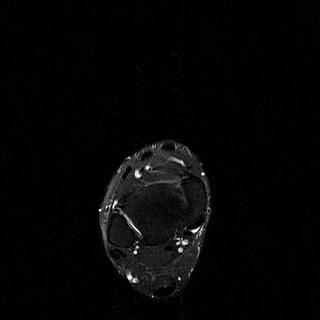
[im 35/35]
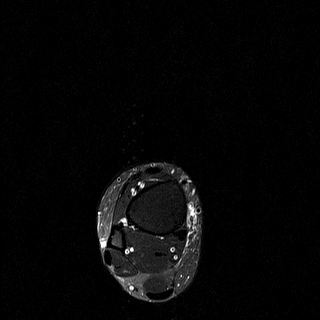

[Series 5: T2 fat-sat · coronal · 3.0mm · 0.53mm/px · 9 of 50 slices shown (2 of 2)]
[im 1/50]
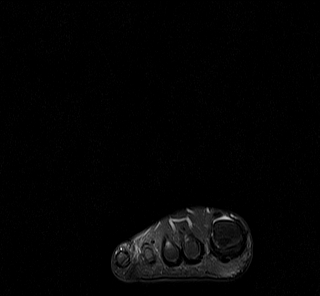
[im 7/50]
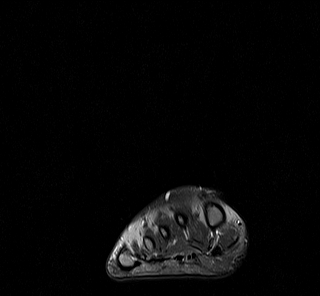
[im 13/50]
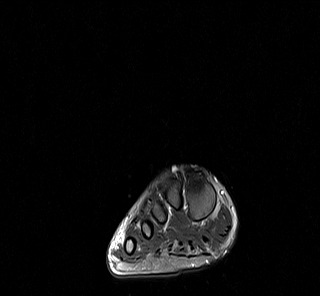
[im 19/50]
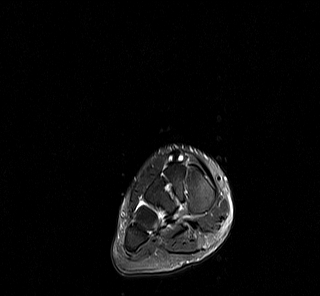
[im 25/50]
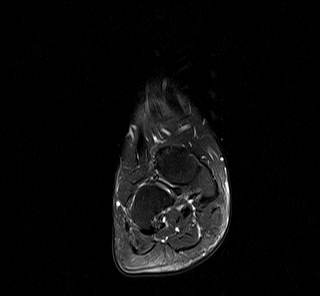
[im 31/50]
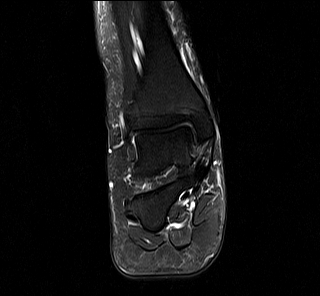
[im 37/50]
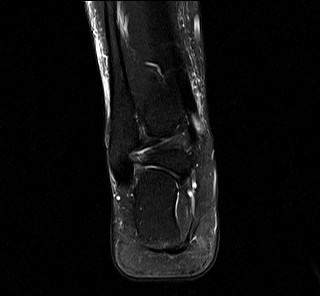
[im 43/50]
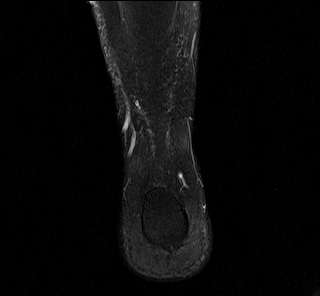
[im 50/50]
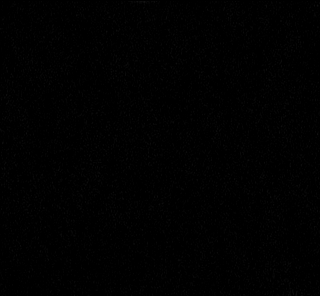

[Series 6: T1 · sagittal · 3.0mm · 0.44mm/px · 5 of 25 slices shown]
[im 1/25]
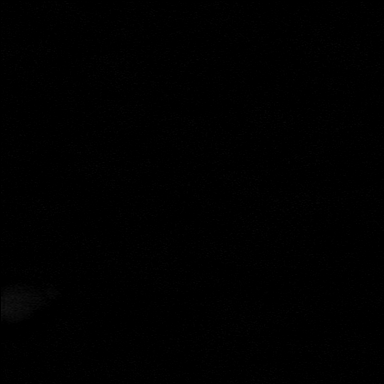
[im 7/25]
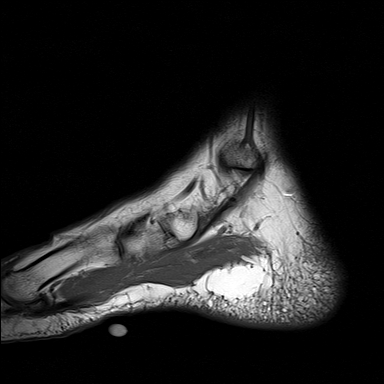
[im 13/25]
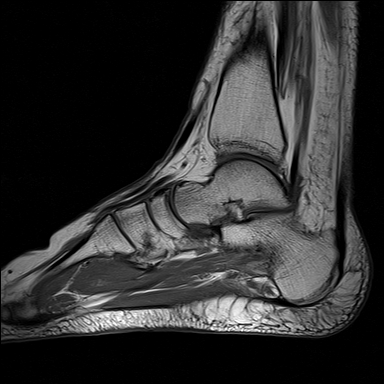
[im 19/25]
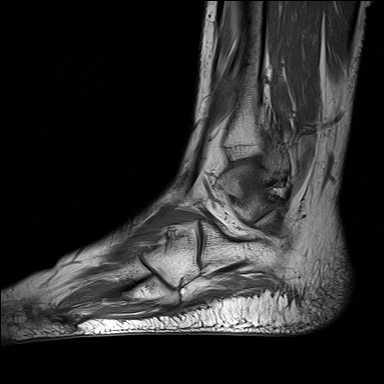
[im 25/25]
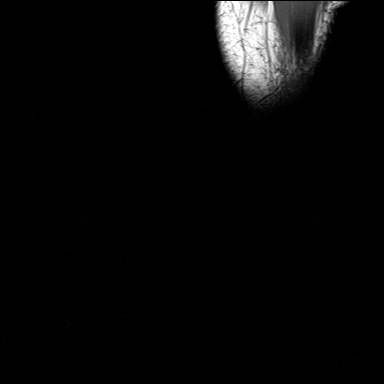

[Series 7: STIR · sagittal · 3.0mm · 0.70mm/px · 5 of 25 slices shown (1 of 2)]
[im 1/25]
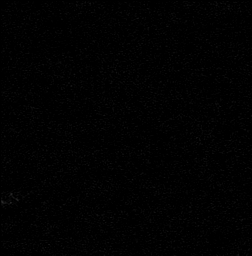
[im 7/25]
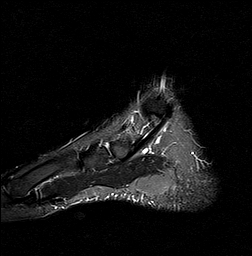
[im 13/25]
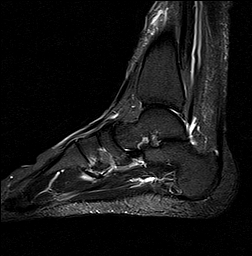
[im 19/25]
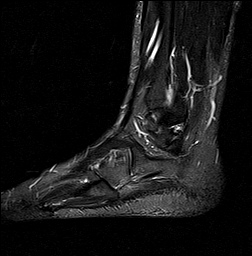
[im 25/25]
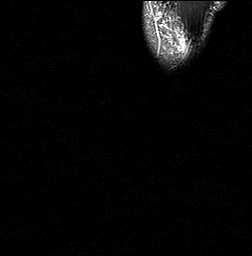

[Series 8: STIR · coronal · 3.0mm · 0.70mm/px · 9 of 50 slices shown (2 of 2)]
[im 1/50]
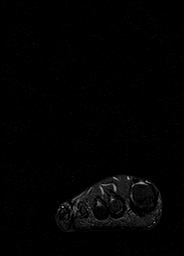
[im 7/50]
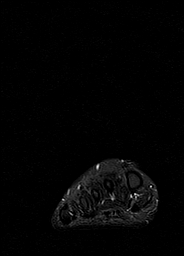
[im 13/50]
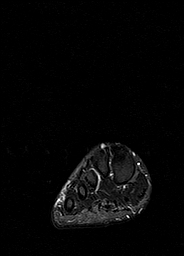
[im 19/50]
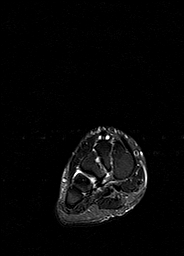
[im 25/50]
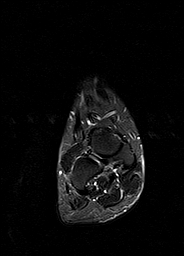
[im 31/50]
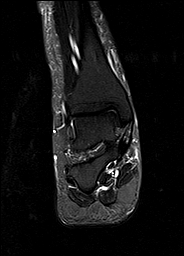
[im 37/50]
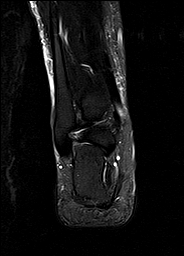
[im 43/50]
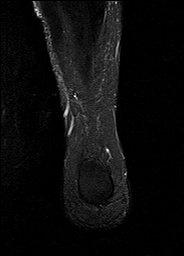
[im 50/50]
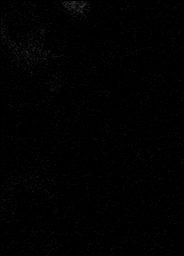

[40 of 40 positions shown; findings below may reference images not displayed]

FINDINGS: TENDONS

Peroneal: Intact and normally positioned. There is a small amount of
fluid surrounding the distal peroneus longus tendon near its
insertion on the first metatarsal base and medial cuneiform.

Posteromedial: Intact and normally positioned. A small amount fluid
in the posterior tibialis and flexor hallucis longus tendon sheaths
is within physiologic limits.

Anterior: Intact and normally positioned.

Achilles: Intact.

Plantar Fascia: Intact. A capsule was placed over the area of pain
in the plantar aspect of the medial forefoot. The the underlying
subcutaneous fat appears normal. There is no nodularity of the
plantar fascia.

LIGAMENTS

Lateral: Intact.

Medial: Intact.

CARTILAGE AND BONES

Ankle Joint: No significant ankle joint effusion. The talar dome and
tibial plafond are intact.

Subtalar Joints/Sinus Tarsi: The subtalar joint and tarsal sinus
appear normal. No midfoot arthropathic changes identified.

Bones: No significant extra-articular osseous findings.

Other: No significant soft tissue findings.
IMPRESSION: 1. No acute findings or clear explanation for the patient's
symptoms.
2. The plantar fascia appears normal. Possible mild distal peroneus
longus tenosynovitis in the plantar aspect of the forefoot.
3. No significant arthropathic changes.
